# Patient Record
Sex: Female | Born: 2000
Health system: Southern US, Academic
[De-identification: ages and names within clinical notes are randomized; demographics above are authoritative.]

## PROBLEM LIST (undated history)

## (undated) ENCOUNTER — Encounter: Attending: Pediatrics | Primary: Pediatrics

## (undated) ENCOUNTER — Telehealth

## (undated) ENCOUNTER — Encounter

## (undated) ENCOUNTER — Ambulatory Visit

## (undated) ENCOUNTER — Telehealth: Attending: Pediatrics | Primary: Pediatrics

## (undated) ENCOUNTER — Ambulatory Visit: Payer: PRIVATE HEALTH INSURANCE

## (undated) ENCOUNTER — Non-Acute Institutional Stay: Payer: PRIVATE HEALTH INSURANCE | Attending: Pediatrics | Primary: Pediatrics

## (undated) ENCOUNTER — Encounter: Payer: PRIVATE HEALTH INSURANCE | Attending: Pediatrics | Primary: Pediatrics

## (undated) ENCOUNTER — Ambulatory Visit: Admission: EM | Payer: Self-pay | Source: Home / Self Care

## (undated) DIAGNOSIS — M0800 Unspecified juvenile rheumatoid arthritis of unspecified site: Secondary | ICD-10-CM

## (undated) DIAGNOSIS — H93293 Other abnormal auditory perceptions, bilateral: Secondary | ICD-10-CM

## (undated) DIAGNOSIS — G43109 Migraine with aura, not intractable, without status migrainosus: Secondary | ICD-10-CM

## (undated) DIAGNOSIS — B3731 Acute candidiasis of vulva and vagina: Secondary | ICD-10-CM

## (undated) DIAGNOSIS — B9689 Other specified bacterial agents as the cause of diseases classified elsewhere: Secondary | ICD-10-CM

## (undated) HISTORY — DX: Migraine with aura, not intractable, without status migrainosus: G43.109

## (undated) HISTORY — DX: Unspecified juvenile rheumatoid arthritis of unspecified site: M08.00

## (undated) HISTORY — PX: TYMPANOSTOMY TUBE PLACEMENT: SHX32

## (undated) HISTORY — DX: Other abnormal auditory perceptions, bilateral: H93.293

---
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
## 2021-02-18 ENCOUNTER — Ambulatory Visit: Payer: BC Managed Care – PPO

## 2021-02-18 ENCOUNTER — Other Ambulatory Visit: Payer: Self-pay

## 2021-02-18 DIAGNOSIS — Z3042 Encounter for surveillance of injectable contraceptive: Secondary | ICD-10-CM

## 2021-02-18 MED ORDER — MEDROXYPROGESTERONE ACETATE 150 MG/ML IM SUSP
150.0000 mg | Freq: Once | INTRAMUSCULAR | Status: AC
  Administered 2021-02-18: 150 mg via INTRAMUSCULAR

## 2021-05-06 ENCOUNTER — Other Ambulatory Visit: Payer: Self-pay

## 2021-05-06 ENCOUNTER — Ambulatory Visit: Payer: Self-pay

## 2021-11-03 ENCOUNTER — Ambulatory Visit: Admit: 2021-11-03 | Discharge: 2021-11-03 | Discharge status: Against Medical Advice | Payer: PRIVATE HEALTH INSURANCE

## 2021-11-03 ENCOUNTER — Ambulatory Visit
Admit: 2021-11-03 | Discharge: 2021-11-03 | Disposition: A | Discharge status: Home / Self Care | Payer: PRIVATE HEALTH INSURANCE

## 2021-11-03 DIAGNOSIS — R1030 Lower abdominal pain, unspecified: Principal | ICD-10-CM

## 2021-11-16 ENCOUNTER — Other Ambulatory Visit: Payer: Self-pay | Admitting: Obstetrics and Gynecology

## 2021-11-16 DIAGNOSIS — Z3042 Encounter for surveillance of injectable contraceptive: Secondary | ICD-10-CM

## 2021-11-17 ENCOUNTER — Other Ambulatory Visit: Payer: Self-pay | Admitting: Obstetrics and Gynecology

## 2021-11-17 ENCOUNTER — Telehealth: Payer: Self-pay

## 2021-11-17 DIAGNOSIS — Z3042 Encounter for surveillance of injectable contraceptive: Secondary | ICD-10-CM

## 2021-11-17 NOTE — Telephone Encounter (Signed)
Rx RF sent, no answer, left detailed msg Rx sent to pharmacy.

## 2021-11-17 NOTE — Telephone Encounter (Signed)
Patient is scheduled for tomorrow for annual exam. Patient is requesting refill. Please contact patient about refill. Thank you!

## 2021-11-25 ENCOUNTER — Encounter: Payer: Self-pay | Admitting: Obstetrics and Gynecology

## 2021-11-25 ENCOUNTER — Ambulatory Visit (INDEPENDENT_AMBULATORY_CARE_PROVIDER_SITE_OTHER): Payer: BLUE CROSS/BLUE SHIELD | Admitting: Obstetrics and Gynecology

## 2021-11-25 ENCOUNTER — Other Ambulatory Visit (HOSPITAL_COMMUNITY): Admission: RE | Admit: 2021-11-25 | Payer: BLUE CROSS/BLUE SHIELD | Source: Ambulatory Visit

## 2021-11-25 ENCOUNTER — Other Ambulatory Visit: Payer: Self-pay

## 2021-11-25 VITALS — BP 100/70 | Ht 67.0 in | Wt 132.0 lb

## 2021-11-25 DIAGNOSIS — Z01419 Encounter for gynecological examination (general) (routine) without abnormal findings: Secondary | ICD-10-CM

## 2021-11-25 DIAGNOSIS — Z3042 Encounter for surveillance of injectable contraceptive: Secondary | ICD-10-CM

## 2021-11-25 DIAGNOSIS — N941 Unspecified dyspareunia: Secondary | ICD-10-CM

## 2021-11-25 DIAGNOSIS — Z113 Encounter for screening for infections with a predominantly sexual mode of transmission: Secondary | ICD-10-CM

## 2021-11-25 MED ORDER — MEDROXYPROGESTERONE ACETATE 150 MG/ML IM SUSY: PREFILLED_SYRINGE | INTRAMUSCULAR | 3 refills | Status: AC

## 2021-11-25 NOTE — Patient Instructions (Signed)
I value your feedback and you entrusting us with your care. If you get a [facility] patient survey, I would appreciate you taking the time to let us know about your experience today. Thank you! ? ? ?

## 2021-11-25 NOTE — Progress Notes (Signed)
PCP:  Patient, No Pcp Per (Inactive)   Chief Complaint  Patient presents with   Gynecologic Exam    No concerns     HPI:      Ms. Virginia Bryant is a 21 y.o. G0P0000 who LMP was No LMP recorded. Patient has had an injection., presents today for her annual examination.  Her menses are absent with depo, no dysmen. Occas BTB for 2-5 days.  Sex activity: currently sexually active--contraception: depo. Has had some vaginal pain recently, sx increased with water-based lubricant. Pt to confirm it was without scent/flavor. Partner also using new soap and sx started after this change.  Hx of STDs: none  There is no FH of breast cancer. There is no FH of ovarian cancer. The patient doesn't do self-breast exams.  Tobacco use: The patient denies current or previous tobacco use. Alcohol use: none No drug use.  Exercise: min active  She does get adequate calcium but not Vitamin D in her diet. Hx of Vit D deficiency in past.  Donald Prose not done.  Past Medical History:  Diagnosis Date   Juvenile rheumatoid arthritis ([place])    resolved after tx   Migraine with aura    Other abnormal auditory perceptions, bilateral    eyeritis - left    Past Surgical History:  Procedure Laterality Date   TYMPANOSTOMY TUBE PLACEMENT     baby    Family History  Problem Relation Age of Onset   Hypertension Father    Diabetes Maternal Grandmother    Hypertension Maternal Grandmother    Thyroid disease Maternal Grandmother    Diabetes Maternal Grandfather    Diabetes Paternal Grandfather    Cancer Other 20       lung    Social History   Socioeconomic History   Marital status: Single    Spouse name: Not on file   Number of children: Not on file   Years of education: Not on file   Highest education level: Not on file  Occupational History   Not on file  Tobacco Use   Smoking status: Never   Smokeless tobacco: Never  Vaping Use   Vaping Use: Never used  Substance and Sexual Activity    Alcohol use: Never   Drug use: Never   Sexual activity: Yes    Birth control/protection: Injection  Other Topics Concern   Not on file  Social History Narrative   Not on file   Social Determinants of Health   Financial Resource Strain: Not on file  Food Insecurity: Not on file  Transportation Needs: Not on file  Physical Activity: Not on file  Stress: Not on file  Social Connections: Not on file  Intimate Partner Violence: Not on file    Outpatient Medications Prior to Visit  Medication Sig Dispense Refill   medroxyPROGESTERone Acetate 150 MG/ML SUSY INJECT 1 ML INTO THE MUSCLE EVERY 3 MONTHS 1 mL 0   No facility-administered medications prior to visit.      ROS:  Review of Systems  Constitutional:  Negative for fatigue, fever and unexpected weight change.  Respiratory:  Negative for cough, shortness of breath and wheezing.   Cardiovascular:  Negative for chest pain, palpitations and leg swelling.  Gastrointestinal:  Negative for blood in stool, constipation, diarrhea, nausea and vomiting.  Endocrine: Negative for cold intolerance, heat intolerance and polyuria.  Genitourinary:  Positive for dyspareunia. Negative for dysuria, flank pain, frequency, genital sores, hematuria, menstrual problem, pelvic pain, urgency, vaginal bleeding, vaginal  discharge and vaginal pain.  Musculoskeletal:  Negative for arthralgias, back pain, joint swelling and myalgias.  Skin:  Negative for rash.  Neurological:  Negative for dizziness, syncope, light-headedness, numbness and headaches.  Hematological:  Negative for adenopathy.  Psychiatric/Behavioral:  Negative for agitation, confusion, sleep disturbance and suicidal ideas. The patient is not nervous/anxious.  BREAST: No symptoms   Objective: BP 100/70    Ht 5\' 7"  (1.702 m)    Wt 132 lb (59.9 kg)    BMI 20.67 kg/m    Physical Exam Constitutional:      Appearance: She is well-developed.  Genitourinary:     Vulva normal.     Right  Labia: No rash, tenderness or lesions.    Left Labia: No tenderness, lesions or rash.       No vaginal discharge, erythema or tenderness.      Right Adnexa: not tender and no mass present.    Left Adnexa: not tender and no mass present.    No cervical friability or polyp.     Uterus is not enlarged or tender.  Breasts:    Right: No mass, nipple discharge, skin change or tenderness.     Left: No mass, nipple discharge, skin change or tenderness.  Neck:     Thyroid: No thyromegaly.  Cardiovascular:     Rate and Rhythm: Normal rate and regular rhythm.     Heart sounds: Normal heart sounds. No murmur heard. Pulmonary:     Effort: Pulmonary effort is normal.     Breath sounds: Normal breath sounds.  Abdominal:     Palpations: Abdomen is soft.     Tenderness: There is no abdominal tenderness. There is no guarding or rebound.  Musculoskeletal:        General: Normal range of motion.     Cervical back: Normal range of motion.  Lymphadenopathy:     Cervical: No cervical adenopathy.  Neurological:     General: No focal deficit present.     Mental Status: She is alert and oriented to person, place, and time.     Cranial Nerves: No cranial nerve deficit.  Skin:    General: Skin is warm and dry.  Psychiatric:        Mood and Affect: Mood normal.        Behavior: Behavior normal.        Thought Content: Thought content normal.        Judgment: Judgment normal.  Vitals reviewed.    Assessment/Plan: Encounter for annual routine gynecological examination  Screening for STD (sexually transmitted disease) - Plan: Cervicovaginal ancillary only  Encounter for surveillance of injectable contraceptive - Plan: medroxyPROGESTERone Acetate 150 MG/ML SUSY; depo RF eRxd. Cont ca/Increase Vit D.   Dyspareunia in female--use lubricants, dove sens skin soap for pt and partner. F/u prn. May have some extra dryness due to depo use   Meds ordered this encounter  Medications    medroxyPROGESTERone Acetate 150 MG/ML SUSY    Sig: INJECT 1 ML INTO THE MUSCLE EVERY 3 MONTHS    Dispense:  1 mL    Refill:  3    Order Specific Question:   Supervising Provider    Answer:   Gae Dry [id number]             GYN counsel STD prevention, adequate intake of calcium and vitamin D     F/U  Return in about 1 year (around 11/25/2022).  Bradlee Heitman B. Zethan Alfieri, PA-C 11/25/2021 1:38 PM

## 2021-11-29 LAB — CERVICOVAGINAL ANCILLARY ONLY
Chlamydia: NEGATIVE
Comment: NEGATIVE
Comment: NORMAL
Neisseria Gonorrhea: NEGATIVE

## 2023-06-11 ENCOUNTER — Ambulatory Visit
Admission: EM | Admit: 2023-06-11 | Discharge: 2023-06-11 | Disposition: A | Discharge status: Home / Self Care | Payer: BLUE CROSS/BLUE SHIELD | Attending: Emergency Medicine | Admitting: Emergency Medicine

## 2023-06-11 DIAGNOSIS — Z202 Contact with and (suspected) exposure to infections with a predominantly sexual mode of transmission: Secondary | ICD-10-CM | POA: Insufficient documentation

## 2023-06-11 DIAGNOSIS — N390 Urinary tract infection, site not specified: Secondary | ICD-10-CM | POA: Insufficient documentation

## 2023-06-11 DIAGNOSIS — N760 Acute vaginitis: Secondary | ICD-10-CM | POA: Insufficient documentation

## 2023-06-11 DIAGNOSIS — B3731 Acute candidiasis of vulva and vagina: Secondary | ICD-10-CM | POA: Insufficient documentation

## 2023-06-11 DIAGNOSIS — B9689 Other specified bacterial agents as the cause of diseases classified elsewhere: Secondary | ICD-10-CM | POA: Insufficient documentation

## 2023-06-11 HISTORY — DX: Acute candidiasis of vulva and vagina: B37.31

## 2023-06-11 HISTORY — DX: Other specified bacterial agents as the cause of diseases classified elsewhere: B96.89

## 2023-06-11 LAB — URINALYSIS, W/ REFLEX TO CULTURE (INFECTION SUSPECTED)
Glucose, UA: NEGATIVE mg/dL
Ketones, ur: 40 mg/dL — AB
Nitrite: NEGATIVE
Protein, ur: >300 mg/dL — AB
RBC / HPF: >50 RBC/hpf (ref 0–5)
Specific Gravity, Urine: 1.025 (ref 1.005–1.030)
WBC, UA: >50 WBC/hpf (ref 0–5)
pH: 7 (ref 5.0–8.0)

## 2023-06-11 LAB — WET PREP, GENITAL
Sperm: NONE SEEN
Trich, Wet Prep: NONE SEEN
WBC, Wet Prep HPF POC: <10 — AB (ref <10)

## 2023-06-11 MED ORDER — PHENAZOPYRIDINE HCL 200 MG PO TABS: 200.0000 mg | ORAL_TABLET | Freq: Three times a day (TID) | ORAL | 0 refills | Status: AC

## 2023-06-11 MED ORDER — FLUCONAZOLE 150 MG PO TABS
150.0000 mg | ORAL_TABLET | ORAL | 0 refills | Status: AC
Start: 2023-06-11 — End: 2023-06-18

## 2023-06-11 MED ORDER — METRONIDAZOLE 500 MG PO TABS: 500.0000 mg | ORAL_TABLET | Freq: Two times a day (BID) | ORAL | 0 refills | Status: AC

## 2023-06-11 MED ORDER — CEFTRIAXONE SODIUM 1 G IJ SOLR
1.0000 g | Freq: Once | INTRAMUSCULAR | Status: AC
  Administered 2023-06-11: 1 g via INTRAMUSCULAR

## 2023-06-11 MED ORDER — AZITHROMYCIN 500 MG PO TABS
1000.0000 mg | ORAL_TABLET | Freq: Once | ORAL | Status: AC
  Administered 2023-06-11: 1000 mg via ORAL

## 2023-06-11 MED ORDER — CEFDINIR 300 MG PO CAPS
300.0000 mg | ORAL_CAPSULE | Freq: Two times a day (BID) | ORAL | 0 refills | Status: AC
Start: 2023-06-11 — End: 2023-06-16

## 2023-06-11 NOTE — ED Provider Notes (Signed)
[facility]    CSN: [id number] Arrival date & time: 06/11/23  1216      History   Chief Complaint Chief Complaint  Patient presents with   Dysuria   Exposure to STD    HPI Virginia Bryant is a 22 y.o. female.   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  She denies any fever, blood in her urine, vaginal discharge or itching.  She recently acquired a new sexual partner and was informed that they tested positive for chlamydia.  She reports that she has not talked to her partner since being informed about the possibility of their being infected with chlamydia.  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) 01/12/2016   Migraine with aura and without status migrainosus, not intractable 06/26/2015   Goiter diffuse 06/01/2015   Chronic iridocyclitis of left eye 10/15/2014   Medication monitoring encounter 10/02/2013   High risk medication use 07/03/2013   JIA (juvenile idiopathic arthritis) (HCC) 07/03/2013    Past Surgical History:  Procedure Laterality Date   TYMPANOSTOMY TUBE PLACEMENT     baby    OB History     Gravida  0   Para  0   Term  0   Preterm  0   AB  0   Living  0      SAB  0   IAB  0   Ectopic  0   Multiple  0   Live Births  0            Home Medications     Prior to Admission medications   Medication Sig Start Date End Date Taking? Authorizing Provider  medroxyPROGESTERone Acetate 150 MG/ML SUSY INJECT 1 ML INTO THE MUSCLE EVERY 3 MONTHS 11/25/21   Copland, Ilona Sorrel, PA-C    Family History Family History  Problem Relation Age of Onset   Hypertension Father    Diabetes Maternal Grandmother    Hypertension Maternal Grandmother    Thyroid disease Maternal Grandmother    Diabetes Maternal Grandfather    Diabetes Paternal Grandfather    Cancer Other 10       lung    Social History Social History   Tobacco Use   Smoking status: Never   Smokeless tobacco: Never  Vaping Use   Vaping status: Never Used  Substance Use Topics   Alcohol use: Never   Drug use: Never     Allergies   Patient has no known allergies.   Review of Systems Review of Systems  Constitutional:  Negative for fever.  Gastrointestinal:  Negative for abdominal pain.  Genitourinary:  Positive for dysuria, frequency and urgency. Negative for hematuria, vaginal discharge and vaginal pain.  Musculoskeletal:  Negative for back pain.     Physical Exam Triage Vital Signs ED Triage Vitals [06/11/23 1221]  Encounter Vitals Group     BP      Systolic BP Percentile      Diastolic BP Percentile      Pulse      Resp 16     Temp      Temp Source Oral     SpO2      Weight      Height      Head Circumference      Peak Flow      Pain Score      Pain Loc      Pain Education      Exclude from Growth Chart    No data found.  Updated Vital Signs BP 111/78 (BP Location: Right Arm)   Pulse 80   Temp 98.3 F (36.8 C) (Oral)   Resp 16   Ht 5\' 7"  (1.702 m)   Wt 120 lb (54.4 kg)   LMP 05/22/2023 (Exact Date)   SpO2 98%   BMI 18.79 kg/m   Visual Acuity Right Eye Distance:   Left Eye Distance:   Bilateral Distance:    Right Eye Near:   Left Eye Near:    Bilateral Near:     Physical Exam Vitals and nursing note reviewed.  Constitutional:       Appearance: Normal appearance. She is not ill-appearing.  HENT:     Head: Normocephalic and atraumatic.  Cardiovascular:     Rate and Rhythm: Normal rate and regular rhythm.     Pulses: Normal pulses.     Heart sounds: Normal heart sounds. No murmur heard.    No friction rub. No gallop.  Pulmonary:     Effort: Pulmonary effort is normal.     Breath sounds: Normal breath sounds. No wheezing, rhonchi or rales.  Abdominal:     General: Abdomen is flat.     Palpations: Abdomen is soft.     Tenderness: There is no abdominal tenderness. There is no right CVA tenderness, left CVA tenderness, guarding or rebound.  Skin:    Capillary Refill: Capillary refill takes less than 2 seconds.  Neurological:     General: No focal deficit present.     Mental Status: She is alert and oriented to person, place, and time.      UC Treatments / Results  Labs (all labs ordered are listed, but only abnormal results are displayed) Labs Reviewed  WET PREP, GENITAL - Abnormal; Notable for the following components:      Result Value   Yeast Wet Prep HPF POC PRESENT (*)    Clue Cells Wet Prep HPF POC PRESENT (*)    WBC, Wet Prep HPF POC <10 (*)    All other components within normal limits  URINALYSIS, W/ REFLEX TO CULTURE (INFECTION SUSPECTED) - Abnormal; Notable for the following components:   APPearance CLOUDY (*)    Hgb urine dipstick LARGE (*)    Bilirubin Urine SMALL (*)    Ketones, ur 40 (*)    Protein, ur >300 (*)    Leukocytes,Ua SMALL (*)    Bacteria, UA MANY (*)    All other components within normal limits  CERVICOVAGINAL ANCILLARY ONLY    EKG   Radiology No results found.  Procedures Procedures (including critical care time)  Medications Ordered in UC Medications  cefTRIAXone (ROCEPHIN) injection 1 g (has no administration in time range)  azithromycin (ZITHROMAX) tablet 1,000 mg (1,000 mg Oral Given 06/11/23 1255)    Initial Impression / Assessment and Plan /  UC Course  I  have reviewed the triage vital signs and the nursing notes.  Pertinent labs & imaging results that were available during my care of the patient were reviewed by me and considered in my medical decision making (see chart for details).   Patient is a nontoxic-appearing 22 year old female presenting for evaluation of urinary symptoms, as well as possible chlamydia exposure, as outlined in HPI above.  On exam patient's cardiopulmonary exam is benign.  No CVA tenderness on exam.  Abdomen soft, flat, and nontender.  I we will order vaginal wet prep as well as urinalysis to evaluate for the presence of BV, vaginal yeast infection, or UTI.  I will also order vaginal cytology swab to evaluate the presence of gonorrhea and chlamydia.  Given the presumptive exposure to someone who is positive for chlamydia we discussed treating empirically with doxycycline while we await the Aptima swab results.  The patient is in agreement.  Wet prep is positive for both yeast and clue cells.  Urinalysis shows cloudy appearance with large hemoglobin, small bilirubin, 40 ketones, greater than 300 protein, and small leukocyte esterase.  Negative for nitrites.  Skin contamination with 6-10 squamous epithelials but a large number of WBCs, RBCs, many bacteria, WBC clumps, and calcium oxalate crystals.  I will treat patient for presumptive chlamydia infection with azithromycin 1 g administered here in clinic and then discharge her home on cefdinir 300 mg twice daily for 5 days for treatment of her urinary tract infection along with Pyridium to help with urinary discomfort.  For the vaginal yeast infection I will treat her with Diflucan 150 mg tablets, 1 tablet today and repeat every 3 days for total of 3 doses.  For the bacterial vaginosis I will treat her with metronidazole 5 mg twice daily for 7 days.   Final Clinical Impressions(s) / UC Diagnoses   Final diagnoses:  BV (bacterial vaginosis)  Possible exposure to STD  Lower  urinary tract infectious disease  Vaginal yeast infection     Discharge Instructions      Your testing today revealed that you have both bacterial vaginosis as well as a vaginal yeast infection, and a urinary tract infection.  This can require multiple antibiotics and antifungals.  We are also treating you presumptively for chlamydia with 1000 mg of azithromycin that was administered here in clinic.  This is a one-time treatment.  Take the Flagyl (metronidazole) 500 mg twice daily for treatment of your bacterial vaginosis.  Avoid alcohol while on the metronidazole as taken together will cause of vomiting.  Bacterial vaginosis is often caused by a imbalance of bacteria in your vaginal vault.  This is sometimes a result of using tampons or hormonal fluctuations during her menstrual cycle.  You if your symptoms are recurrent you can try using a boric acid suppository twice weekly to help maintain the acid-base balance in your vagina vault which could prevent further infection.  You can also try vaginal probiotics to help return normal bacterial balance.   For your vaginal yeast infection take Diflucan 150 mg tablets.  Take 1 tablet today and then repeat dosing every 3 days for total of 3 doses.  Your swab to look for the presence of gonorrhea or chlamydia will be back tomorrow.  If you test positive for either infection you will be contacted via phone.  You will already have been treated for for both infections if you test positive.     ED Prescriptions   None  PDMP not reviewed this encounter.   Becky Augusta, NP 06/11/23 1256

## 2023-06-11 NOTE — Discharge Instructions (Addendum)
Your testing today revealed that you have both bacterial vaginosis as well as a vaginal yeast infection, and a urinary tract infection.  This can require multiple antibiotics and antifungals.  We are also treating you presumptively for chlamydia with 1000 mg of azithromycin that was administered here in clinic.  This is a one-time treatment.  Take the Flagyl (metronidazole) 500 mg twice daily for treatment of your bacterial vaginosis.  Avoid alcohol while on the metronidazole as taken together will cause of vomiting.  Bacterial vaginosis is often caused by a imbalance of bacteria in your vaginal vault.  This is sometimes a result of using tampons or hormonal fluctuations during her menstrual cycle.  You if your symptoms are recurrent you can try using a boric acid suppository twice weekly to help maintain the acid-base balance in your vagina vault which could prevent further infection.  You can also try vaginal probiotics to help return normal bacterial balance.   For your vaginal yeast infection take Diflucan 150 mg tablets.  Take 1 tablet today and then repeat dosing every 3 days for total of 3 doses.  Take the Cefdinir twice daily for 5 days with food for treatment of urinary tract infection.  Use the Pyridium every 8 hours as needed for urinary discomfort.  This will turn your urine a bright red-orange.  Increase your oral fluid intake so that you increase your urine production and or flushing your urinary system.  Take an over-the-counter probiotic, such as Culturelle-Align-Activia, 1 hour after each dose of antibiotic to prevent diarrhea or yeast infections from forming.  We will culture urine and change the antibiotics if necessary.  Return for reevaluation, or see your primary care provider, for any new or worsening symptoms.   Your swab to look for the presence of gonorrhea or chlamydia will be back tomorrow.  If you test positive for either infection you will be contacted via  phone.  You will already have been treated for for both infections if you test positive.

## 2023-06-11 NOTE — ED Triage Notes (Addendum)
Pt c/o urinary freq & burning x2 days. Denies any hematuria. States she was recently with new partner who tested + for chlamydia.
